# Patient Record
Sex: Male | Born: 2000
Health system: Southern US, Community
[De-identification: ages and names within clinical notes are randomized; demographics above are authoritative.]

## PROBLEM LIST (undated history)

## (undated) DIAGNOSIS — K5904 Chronic idiopathic constipation: Secondary | ICD-10-CM

## (undated) DIAGNOSIS — T7840XA Allergy, unspecified, initial encounter: Secondary | ICD-10-CM

## (undated) DIAGNOSIS — Z9109 Other allergy status, other than to drugs and biological substances: Secondary | ICD-10-CM

## (undated) HISTORY — DX: Allergy, unspecified, initial encounter: T78.40XA

## (undated) HISTORY — DX: Chronic idiopathic constipation: K59.04

## (undated) HISTORY — DX: Other allergy status, other than to drugs and biological substances: Z91.09

## (undated) HISTORY — PX: OTHER SURGICAL HISTORY: SHX169

---
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
## 2022-01-21 ENCOUNTER — Ambulatory Visit (INDEPENDENT_AMBULATORY_CARE_PROVIDER_SITE_OTHER): Payer: BC Managed Care – PPO | Admitting: Sports Medicine

## 2022-01-21 ENCOUNTER — Ambulatory Visit (INDEPENDENT_AMBULATORY_CARE_PROVIDER_SITE_OTHER): Payer: BC Managed Care – PPO

## 2022-01-21 DIAGNOSIS — M25561 Pain in right knee: Secondary | ICD-10-CM | POA: Diagnosis not present

## 2022-01-21 DIAGNOSIS — M25562 Pain in left knee: Secondary | ICD-10-CM | POA: Diagnosis not present

## 2022-01-21 DIAGNOSIS — Z09 Encounter for follow-up examination after completed treatment for conditions other than malignant neoplasm: Secondary | ICD-10-CM

## 2022-01-21 MED ORDER — MELOXICAM 15 MG PO TABS: ORAL_TABLET | ORAL | 3 refills | Status: AC

## 2022-01-21 NOTE — Assessment & Plan Note (Addendum)
Pleasant 21 year old male, several weeks of increasing anterior knee pain, difficult to reproduce on exam, he does have some reproduction of pain with terminal extension and terminal flexion. ?Also has weak hip abductors left worse than right. ?McMurray's negative, ligaments intact. ?I think he has patellofemoral syndrome, adding x-rays, formal PT, meloxicam, we did discuss the biomechanics. ?Return to see me in 6 weeks, MRI if not better. ?

## 2022-01-21 NOTE — Progress Notes (Signed)
? ? ?  Procedures performed today:   ? ?None. ? ?Independent interpretation of notes and tests performed by another provider:  ? ?None. ? ?Brief History, Exam, Impression, and Recommendations:   ? ?Anterior knee pain, left ?Pleasant 21 year old male, several weeks of increasing anterior knee pain, difficult to reproduce on exam, he does have some reproduction of pain with terminal extension and terminal flexion. ?Also has weak hip abductors left worse than right. ?McMurray's negative, ligaments intact. ?I think he has patellofemoral syndrome, adding x-rays, formal PT, meloxicam, we did discuss the biomechanics. ?Return to see me in 6 weeks, MRI if not better. ? ? ? ?___________________________________________ ?Ihor Austin. Benjamin Stain, M.D., ABFM., CAQSM. ?Primary Care and Sports Medicine ?[facility] ? ?Adjunct Instructor of Family Medicine  ?[employer] ?

## 2022-01-31 ENCOUNTER — Ambulatory Visit: Payer: BC Managed Care – PPO | Admitting: Sports Medicine

## 2022-03-04 ENCOUNTER — Ambulatory Visit: Payer: BC Managed Care – PPO | Admitting: Sports Medicine

## 2022-05-09 DIAGNOSIS — Z23 Encounter for immunization: Secondary | ICD-10-CM | POA: Diagnosis not present

## 2022-05-09 DIAGNOSIS — R059 Cough, unspecified: Secondary | ICD-10-CM | POA: Diagnosis not present

## 2022-05-09 DIAGNOSIS — J018 Other acute sinusitis: Secondary | ICD-10-CM | POA: Diagnosis not present

## 2022-05-09 DIAGNOSIS — R0602 Shortness of breath: Secondary | ICD-10-CM | POA: Diagnosis not present

## 2022-06-03 ENCOUNTER — Emergency Department (HOSPITAL_BASED_OUTPATIENT_CLINIC_OR_DEPARTMENT_OTHER)
Admission: EM | Admit: 2022-06-03 | Discharge: 2022-06-03 | Disposition: A | Discharge status: Home / Self Care | Payer: BC Managed Care – PPO | Attending: Emergency Medicine | Admitting: Emergency Medicine

## 2022-06-03 ENCOUNTER — Other Ambulatory Visit (HOSPITAL_BASED_OUTPATIENT_CLINIC_OR_DEPARTMENT_OTHER): Payer: Self-pay

## 2022-06-03 ENCOUNTER — Encounter (HOSPITAL_BASED_OUTPATIENT_CLINIC_OR_DEPARTMENT_OTHER): Payer: Self-pay

## 2022-06-03 ENCOUNTER — Other Ambulatory Visit: Payer: Self-pay

## 2022-06-03 DIAGNOSIS — K529 Noninfective gastroenteritis and colitis, unspecified: Secondary | ICD-10-CM | POA: Insufficient documentation

## 2022-06-03 DIAGNOSIS — R197 Diarrhea, unspecified: Secondary | ICD-10-CM | POA: Diagnosis not present

## 2022-06-03 LAB — COMPREHENSIVE METABOLIC PANEL
ALT: 21 U/L (ref 0–44)
AST: 26 U/L (ref 15–41)
Albumin: 4.8 g/dL (ref 3.5–5.0)
Alkaline Phosphatase: 87 U/L (ref 38–126)
Anion gap: 9 (ref 5–15)
BUN: 22 mg/dL — ABNORMAL HIGH (ref 6–20)
CO2: 25 mmol/L (ref 22–32)
Calcium: 9.4 mg/dL (ref 8.9–10.3)
Chloride: 104 mmol/L (ref 98–111)
Creatinine, Ser: 1.32 mg/dL — ABNORMAL HIGH (ref 0.61–1.24)
GFR, Estimated: >60 mL/min (ref >60)
Glucose, Bld: 122 mg/dL — ABNORMAL HIGH (ref 70–99)
Potassium: 4.3 mmol/L (ref 3.5–5.1)
Sodium: 138 mmol/L (ref 135–145)
Total Bilirubin: 1.2 mg/dL (ref 0.3–1.2)
Total Protein: 8.1 g/dL (ref 6.5–8.1)

## 2022-06-03 LAB — URINALYSIS, ROUTINE W REFLEX MICROSCOPIC
Bilirubin Urine: NEGATIVE
Glucose, UA: NEGATIVE mg/dL
Hgb urine dipstick: NEGATIVE
Ketones, ur: NEGATIVE mg/dL
Leukocytes,Ua: NEGATIVE
Nitrite: NEGATIVE
Protein, ur: 100 mg/dL — AB
Specific Gravity, Urine: 1.025 (ref 1.005–1.030)
pH: 6 (ref 5.0–8.0)

## 2022-06-03 LAB — URINALYSIS, MICROSCOPIC (REFLEX)

## 2022-06-03 LAB — CBC
HCT: 48.3 % (ref 39.0–52.0)
Hemoglobin: 16.8 g/dL (ref 13.0–17.0)
MCH: 31.4 pg (ref 26.0–34.0)
MCHC: 34.8 g/dL (ref 30.0–36.0)
MCV: 90.3 fL (ref 80.0–100.0)
Platelets: 291 10*3/uL (ref 150–400)
RBC: 5.35 MIL/uL (ref 4.22–5.81)
RDW: 12.4 % (ref 11.5–15.5)
WBC: 6.3 10*3/uL (ref 4.0–10.5)
nRBC: 0 % (ref 0.0–0.2)

## 2022-06-03 LAB — LIPASE, BLOOD: Lipase: 23 U/L (ref 11–51)

## 2022-06-03 MED ORDER — SODIUM CHLORIDE 0.9 % IV BOLUS
1000.0000 mL | Freq: Once | INTRAVENOUS | Status: AC
  Administered 2022-06-03: 1000 mL via INTRAVENOUS

## 2022-06-03 MED ORDER — ONDANSETRON HCL 4 MG/2ML IJ SOLN
4.0000 mg | Freq: Once | INTRAMUSCULAR | Status: AC
  Administered 2022-06-03: 4 mg via INTRAVENOUS
  Filled 2022-06-03: qty 2

## 2022-06-03 MED ORDER — ONDANSETRON 4 MG PO TBDP
4.0000 mg | ORAL_TABLET | Freq: Three times a day (TID) | ORAL | 1 refills | Status: AC | PRN
  Filled 2022-06-03: qty 9, 3d supply, fill #0

## 2022-06-03 NOTE — ED Provider Notes (Addendum)
[facility] EMERGENCY DEPARTMENT Provider Note   CSN: [id number] Arrival date & time: 06/03/22  2094     History  Chief Complaint  Patient presents with   Vomiting    Eric Walker is a 21 y.o. male.  Patient with acute onset of nausea vomiting and diarrhea at 2100.  Associated with some diffuse abdominal pain but not severe.  No blood in the vomit no blood in the diarrhea.  Patient concerned about may be a stomach bug or food poisoning.  No fevers.  Past medical history significant for environmental allergies and functional constipation.  Patient is currently involved with football practice.  No injury related to that.       Home Medications Prior to Admission medications   Medication Sig Start Date End Date Taking? Authorizing Provider  loratadine (CLARITIN) 5 MG chewable tablet Chew 5 mg by mouth daily.    [provider]  meloxicam (MOBIC) 15 MG tablet One tab PO every 24 hours with a meal for 2 weeks, then once every 24 hours prn pain. 01/21/22   Monica Becton, MD  mometasone (NASONEX) 50 MCG/ACT nasal spray Place 2 sprays into the nose daily.    [provider]      Allergies    Other and Prunus persica    Review of Systems   Review of Systems  Constitutional:  Negative for chills and fever.  HENT:  Negative for ear pain and sore throat.   Eyes:  Negative for pain and visual disturbance.  Respiratory:  Negative for cough and shortness of breath.   Cardiovascular:  Negative for chest pain and palpitations.  Gastrointestinal:  Positive for abdominal pain, diarrhea, nausea and vomiting.  Genitourinary:  Negative for dysuria and hematuria.  Musculoskeletal:  Negative for arthralgias and back pain.  Skin:  Negative for color change and rash.  Neurological:  Negative for seizures and syncope.  All other systems reviewed and are negative.   Physical Exam Updated Vital Signs BP 124/71   Pulse 86   Temp 97.7 F (36.5 C)  (Oral)   Resp 18   Ht 1.803 m (5\' 11" )   Wt 78.3 kg   SpO2 100%   BMI 24.08 kg/m  Physical Exam Vitals and nursing note reviewed.  Constitutional:      General: He is not in acute distress.    Appearance: Normal appearance. He is well-developed. He is not ill-appearing.  HENT:     Head: Normocephalic and atraumatic.     Mouth/Throat:     Mouth: Mucous membranes are moist.  Eyes:     Conjunctiva/sclera: Conjunctivae normal.     Pupils: Pupils are equal, round, and reactive to light.  Cardiovascular:     Rate and Rhythm: Normal rate and regular rhythm.     Heart sounds: No murmur heard. Pulmonary:     Effort: Pulmonary effort is normal. No respiratory distress.     Breath sounds: Normal breath sounds.  Abdominal:     General: There is no distension.     Palpations: Abdomen is soft. There is no mass.     Tenderness: There is no abdominal tenderness. There is no guarding.     Hernia: No hernia is present.  Musculoskeletal:        General: No swelling.     Cervical back: Neck supple.  Skin:    General: Skin is warm and dry.     Capillary Refill: Capillary refill takes less than 2 seconds.  Neurological:     General: No focal deficit present.     Mental Status: He is alert and oriented to person, place, and time.     Cranial Nerves: No cranial nerve deficit.     Sensory: No sensory deficit.  Psychiatric:        Mood and Affect: Mood normal.     ED Results / Procedures / Treatments   Labs (all labs ordered are listed, but only abnormal results are displayed) Labs Reviewed  URINALYSIS, ROUTINE W REFLEX MICROSCOPIC - Abnormal; Notable for the following components:      Result Value   Color, Urine AMBER (*)    Protein, ur 100 (*)    All other components within normal limits  COMPREHENSIVE METABOLIC PANEL - Abnormal; Notable for the following components:   Glucose, Bld 122 (*)    BUN 22 (*)    Creatinine, Ser 1.32 (*)    All other components within normal limits   URINALYSIS, MICROSCOPIC (REFLEX) - Abnormal; Notable for the following components:   Bacteria, UA RARE (*)    All other components within normal limits  LIPASE, BLOOD  CBC    EKG None  Radiology No results found.  Procedures Procedures    Medications Ordered in ED Medications  sodium chloride 0.9 % bolus 1,000 mL (1,000 mLs Intravenous New Bag/Given 06/03/22 1106)  ondansetron (ZOFRAN) injection 4 mg (4 mg Intravenous Given 06/03/22 1107)    ED Course/ Medical Decision Making/ A&P                           Medical Decision Making Amount and/or Complexity of Data Reviewed Labs: ordered.  Risk Prescription drug management.   Patient with less than 24-hour onset of acute nausea vomiting and diarrhea.  Most likely gastroenteritis.  Labs without significant abnormalities other than creatinine being up at 1.32 GFR still greater than 60.  Liver function tests normal lipase normal CBC no leukocytosis hemoglobin 16.8.  Urinalysis negative.  We will give 1 L of normal saline antinausea medicine and can probably be discharged home with a antinausea medicine.  Do not suspect an acute abdominal process.  Patient feeling better after the Zofran and IV fluids.  No further vomiting.   Final Clinical Impression(s) / ED Diagnoses Final diagnoses:  Gastroenteritis    Rx / DC Orders ED Discharge Orders     None         Vanetta Mulders, MD 06/03/22 1120    Vanetta Mulders, MD 06/03/22 1157

## 2022-06-03 NOTE — ED Triage Notes (Signed)
N/V/D since last night. Able to tolerate PO water this morning. Upper abdominal pain.

## 2022-06-03 NOTE — ED Notes (Signed)
Discharge instructions reviewed with patient and father. Patient verbalizes understanding, no further questions at this time. Medications/prescriptions and follow up information provided. No acute distress noted at time of departure.

## 2022-06-03 NOTE — Discharge Instructions (Signed)
Take the Zofran as needed for nausea and vomiting.  Some food choice recommendations provided for the diarrhea.  Would expect improvement over the next couple days.  Today's labs without any significant abnormalities.

## 2023-01-13 ENCOUNTER — Other Ambulatory Visit: Payer: Self-pay

## 2023-01-13 ENCOUNTER — Encounter (HOSPITAL_BASED_OUTPATIENT_CLINIC_OR_DEPARTMENT_OTHER): Payer: Self-pay

## 2023-01-13 ENCOUNTER — Emergency Department (HOSPITAL_BASED_OUTPATIENT_CLINIC_OR_DEPARTMENT_OTHER): Payer: BC Managed Care – PPO

## 2023-01-13 ENCOUNTER — Emergency Department (HOSPITAL_BASED_OUTPATIENT_CLINIC_OR_DEPARTMENT_OTHER)
Admission: EM | Admit: 2023-01-13 | Discharge: 2023-01-13 | Disposition: A | Discharge status: Home / Self Care | Payer: BC Managed Care – PPO | Attending: Emergency Medicine | Admitting: Emergency Medicine

## 2023-01-13 DIAGNOSIS — N50812 Left testicular pain: Secondary | ICD-10-CM | POA: Insufficient documentation

## 2023-01-13 DIAGNOSIS — R109 Unspecified abdominal pain: Secondary | ICD-10-CM | POA: Insufficient documentation

## 2023-01-13 DIAGNOSIS — M545 Low back pain, unspecified: Secondary | ICD-10-CM | POA: Diagnosis not present

## 2023-01-13 LAB — URINALYSIS, ROUTINE W REFLEX MICROSCOPIC
Bilirubin Urine: NEGATIVE
Glucose, UA: NEGATIVE mg/dL
Hgb urine dipstick: NEGATIVE
Ketones, ur: NEGATIVE mg/dL
Leukocytes,Ua: NEGATIVE
Nitrite: NEGATIVE
Protein, ur: NEGATIVE mg/dL
Specific Gravity, Urine: 1.02 (ref 1.005–1.030)
pH: 7 (ref 5.0–8.0)

## 2023-01-13 LAB — COMPREHENSIVE METABOLIC PANEL
ALT: 23 U/L (ref 0–44)
AST: 24 U/L (ref 15–41)
Albumin: 4.4 g/dL (ref 3.5–5.0)
Alkaline Phosphatase: 104 U/L (ref 38–126)
Anion gap: 7 (ref 5–15)
BUN: 16 mg/dL (ref 6–20)
CO2: 27 mmol/L (ref 22–32)
Calcium: 8.7 mg/dL — ABNORMAL LOW (ref 8.9–10.3)
Chloride: 102 mmol/L (ref 98–111)
Creatinine, Ser: 1.1 mg/dL (ref 0.61–1.24)
GFR, Estimated: >60 mL/min (ref >60)
Glucose, Bld: 87 mg/dL (ref 70–99)
Potassium: 3.8 mmol/L (ref 3.5–5.1)
Sodium: 136 mmol/L (ref 135–145)
Total Bilirubin: 0.6 mg/dL (ref 0.3–1.2)
Total Protein: 7.4 g/dL (ref 6.5–8.1)

## 2023-01-13 LAB — CBC WITH DIFFERENTIAL/PLATELET
Abs Immature Granulocytes: 0.01 10*3/uL (ref 0.00–0.07)
Basophils Absolute: 0 10*3/uL (ref 0.0–0.1)
Basophils Relative: 0 %
Eosinophils Absolute: 0.4 10*3/uL (ref 0.0–0.5)
Eosinophils Relative: 7 %
HCT: 42.2 % (ref 39.0–52.0)
Hemoglobin: 14.6 g/dL (ref 13.0–17.0)
Immature Granulocytes: 0 %
Lymphocytes Relative: 34 %
Lymphs Abs: 1.9 10*3/uL (ref 0.7–4.0)
MCH: 31.5 pg (ref 26.0–34.0)
MCHC: 34.6 g/dL (ref 30.0–36.0)
MCV: 91.1 fL (ref 80.0–100.0)
Monocytes Absolute: 0.7 10*3/uL (ref 0.1–1.0)
Monocytes Relative: 13 %
Neutro Abs: 2.6 10*3/uL (ref 1.7–7.7)
Neutrophils Relative %: 46 %
Platelets: 300 10*3/uL (ref 150–400)
RBC: 4.63 MIL/uL (ref 4.22–5.81)
RDW: 12.1 % (ref 11.5–15.5)
WBC: 5.6 10*3/uL (ref 4.0–10.5)
nRBC: 0 % (ref 0.0–0.2)

## 2023-01-13 MED ORDER — NAPROXEN 500 MG PO TABS: 500.0000 mg | ORAL_TABLET | Freq: Two times a day (BID) | ORAL | 0 refills | Status: AC

## 2023-01-13 MED ORDER — METHOCARBAMOL 500 MG PO TABS: 500.0000 mg | ORAL_TABLET | Freq: Four times a day (QID) | ORAL | 0 refills | Status: AC | PRN

## 2023-01-13 NOTE — ED Triage Notes (Signed)
Pt c/o lower back pain, radiating down into testicles onset yesterday. Denies drainage, no concern for STI

## 2023-01-13 NOTE — Discharge Instructions (Signed)
1.  At this time your urine does not show any signs of infection.  There is no blood in the urine.  Your lab work is normal.  Your CT scan does not show a kidney stone or other immediate problem. 2.  You may have a muscular strain in the inguinal region with radiation to the groin.  Try naproxen twice a day and Robaxin as a muscle relaxer as needed. 3.  You should have a recheck with your family doctor to make sure your symptoms are improving. 4.  If you get suddenly worsening pain, testicular swelling, fever or other concerning changes, return to the emergency department for recheck.

## 2023-01-13 NOTE — ED Notes (Signed)
Discharge paperwork reviewed entirely with patient, including Rx's and follow up care. Pain was under control. Pt verbalized understanding as well as all parties involved. No questions or concerns voiced at the time of discharge. No acute distress noted.   Pt ambulated out to PVA without incident or assistance.  

## 2023-01-13 NOTE — ED Notes (Signed)
ED Provider at bedside. 

## 2023-01-13 NOTE — ED Provider Notes (Signed)
[facility]ERG[facility] DEPARTMENT AT [facility] Provider Note   CSN: [id number] Arrival date & time: 01/13/23  1543     History  Chief Complaint  Patient presents with   Back Pain   Testicle Pain    Eric Walker is a 22 y.o. male.  HPI Patient developed lower back pain on the left yesterday.  No immediate injury.  Patient reports that the pain persisted in the lower back and then began radiating into his testicle on the left as well.  Quality is aching in nature.  No fever no nausea no vomiting.  No pain burning urgency with urination.  No blood in the urine.  No potential STD exposure.  Family history positive for kidney stones.    Home Medications Prior to Admission medications   Medication Sig Start Date End Date Taking? Authorizing Provider  methocarbamol (ROBAXIN) 500 MG tablet Take 1 tablet (500 mg total) by mouth every 6 (six) hours as needed for muscle spasms. 01/13/23  Yes Arby Barrette, MD  naproxen (NAPROSYN) 500 MG tablet Take 1 tablet (500 mg total) by mouth 2 (two) times daily. 01/13/23  Yes Arby Barrette, MD  loratadine (CLARITIN) 5 MG chewable tablet Chew 5 mg by mouth daily.    [provider]  meloxicam (MOBIC) 15 MG tablet One tab PO every 24 hours with a meal for 2 weeks, then once every 24 hours prn pain. 01/21/22   Monica Becton, MD  mometasone (NASONEX) 50 MCG/ACT nasal spray Place 2 sprays into the nose daily.    [provider]  ondansetron (ZOFRAN-ODT) 4 MG disintegrating tablet Take 1 tablet (4 mg total) by mouth every 8 (eight) hours as needed. 06/03/22   Vanetta Mulders, MD      Allergies    Other and Prunus persica    Review of Systems   Review of Systems  Physical Exam Updated Vital Signs BP 110/60 (BP Location: Right Arm)   Pulse 68   Temp 98.5 F (36.9 C) (Oral)   Resp 16   Ht 5\' 11"  (1.803 m)   Wt 83.9 kg   SpO2 100%   BMI 25.80 kg/m  Physical Exam Constitutional:      Appearance: Normal  appearance.  HENT:     Mouth/Throat:     Pharynx: Oropharynx is clear.  Eyes:     Extraocular Movements: Extraocular movements intact.  Pulmonary:     Effort: Pulmonary effort is normal.     Breath sounds: Normal breath sounds.  Abdominal:     General: There is no distension.     Palpations: Abdomen is soft.     Tenderness: There is no abdominal tenderness. There is no guarding.  Genitourinary:    Comments: Testicles and scrotum normal.  No scrotal or testicular swelling.  Left testicle nontender smooth contour, spermatic cord nontender. Skin:    General: Skin is warm and dry.  Neurological:     Mental Status: He is alert.     ED Results / Procedures / Treatments   Labs (all labs ordered are listed, but only abnormal results are displayed) Labs Reviewed  COMPREHENSIVE METABOLIC PANEL - Abnormal; Notable for the following components:      Result Value   Calcium 8.7 (*)    All other components within normal limits  URINALYSIS, ROUTINE W REFLEX MICROSCOPIC  CBC WITH DIFFERENTIAL/PLATELET    EKG None  Radiology No results found.  Procedures Procedures    Medications Ordered in ED Medications -  No data to display  ED Course/ Medical Decision Making/ A&P                             Medical Decision Making Amount and/or Complexity of Data Reviewed Labs: ordered. Radiology: ordered.  Risk Prescription drug management.   Patient is otherwise healthy and developed low back pain with associated radiation to the testicle.  No prior history of kidney stone or other urogenital disorder.  Differential diagnosis includes low back strain\kidney stone\UTI\inguinal hernia\testicular torsion\epididymitis.  At this time I have lower suspicion for testicular torsion or epididymitis.  Onset was first for low back pain and then followed by radiation to testicle.  On examination patient does not have any reproducible testicular pain and no objective swelling or other exam  abnormality.  No hernia present.  Will proceed with CT stone study for suspected kidney stone.  Urinalysis normal without blood or signs of infection.  CBC with differential normal chemistry panel normal.  CT stone study negative for stone.  No other significant abnormality.  Radiology notes some increased stool burden but otherwise normal.  At this time with no stone present, differential diagnosis includes passed kidney stone or potential musculoskeletal pain with inguinal strain.  Patient's general condition is good.  Exam is normal.  Patient is comfortable and not having any active pain.  Will plan for starting NSAID treatment muscle relaxer naproxen and Robaxin.  We have carefully reviewed return precautions and recommendation for recheck with PCP to make sure all symptoms are improving with the above treatment.          Final Clinical Impression(s) / ED Diagnoses Final diagnoses:  Flank pain  Testicular pain, left    Rx / DC Orders ED Discharge Orders          Ordered    naproxen (NAPROSYN) 500 MG tablet  2 times daily        01/13/23 2132    methocarbamol (ROBAXIN) 500 MG tablet  Every 6 hours PRN        01/13/23 2132              Arby Barrette, MD 01/29/23 [phone]

## 2023-01-20 ENCOUNTER — Ambulatory Visit: Payer: BC Managed Care – PPO | Admitting: Sports Medicine

## 2023-02-10 DIAGNOSIS — J018 Other acute sinusitis: Secondary | ICD-10-CM | POA: Diagnosis not present

## 2023-02-10 DIAGNOSIS — R059 Cough, unspecified: Secondary | ICD-10-CM | POA: Diagnosis not present

## 2023-05-24 DIAGNOSIS — Z23 Encounter for immunization: Secondary | ICD-10-CM | POA: Diagnosis not present

## 2023-05-26 ENCOUNTER — Telehealth: Payer: Self-pay

## 2023-05-26 NOTE — Telephone Encounter (Signed)
Leah with [employer] and [employer] in [place] calling to verify immunizations given in 2009. Verbalizes understanding.

## 2023-05-29 ENCOUNTER — Telehealth: Payer: Self-pay | Admitting: Family Medicine

## 2023-05-29 NOTE — Telephone Encounter (Signed)
Copied from CRM [id number]. Topic: General - Inquiry >> May 26, 2023 11:22 AM Haroldine Laws wrote: Reason for CRM: Mom called saying she sent a vaccine record from our office to his university but they are not wanting to accept it because it does not have a doctors signature.  Please advise  CB#  [phone] >> May 29, 2023  8:31 AM Reeves Forth wrote: Reviewed the call. The patient is a former peds pt of [facility] 2009 (per Epic). Records will be achieved but should still be available since he was a minor. Per the mom the university needs a copy of immunization record with provider signature on the copy.  Please call mother to further discuss.  >> May 26, 2023 12:39 PM Herbert Seta T wrote: Sent to wrong office.

## 2024-06-16 ENCOUNTER — Encounter: Payer: Self-pay | Admitting: Sports Medicine
